# Patient Record
Sex: Female | Born: 2000 | Race: White | Hispanic: No | Marital: Single | Smoking: Never smoker
Health system: Southern US, Community
[De-identification: ages and names within clinical notes are randomized; demographics above are authoritative.]

## PROBLEM LIST (undated history)

## (undated) DIAGNOSIS — Z8639 Personal history of other endocrine, nutritional and metabolic disease: Secondary | ICD-10-CM

## (undated) DIAGNOSIS — R011 Cardiac murmur, unspecified: Secondary | ICD-10-CM

## (undated) HISTORY — DX: Personal history of other endocrine, nutritional and metabolic disease: Z86.39

## (undated) HISTORY — DX: Cardiac murmur, unspecified: R01.1

---
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
## 2025-02-10 ENCOUNTER — Encounter: Payer: Self-pay | Admitting: Registered Nurse
